# Patient Record
Sex: Male | Born: 2000
Health system: Southern US, Community
[De-identification: ages and names within clinical notes are randomized; demographics above are authoritative.]

---
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
## 2022-03-02 DIAGNOSIS — F4321 Adjustment disorder with depressed mood: Secondary | ICD-10-CM | POA: Diagnosis not present

## 2022-04-04 DIAGNOSIS — F4321 Adjustment disorder with depressed mood: Secondary | ICD-10-CM | POA: Diagnosis not present

## 2022-04-20 IMAGING — CR DG CHEST 2V
2 series · 2 of 2 positions shown · non-contrast
Comparison: None

CLINICAL DATA: Chest pain

EXAM:
CHEST - 2 VIEW

[chest pa]
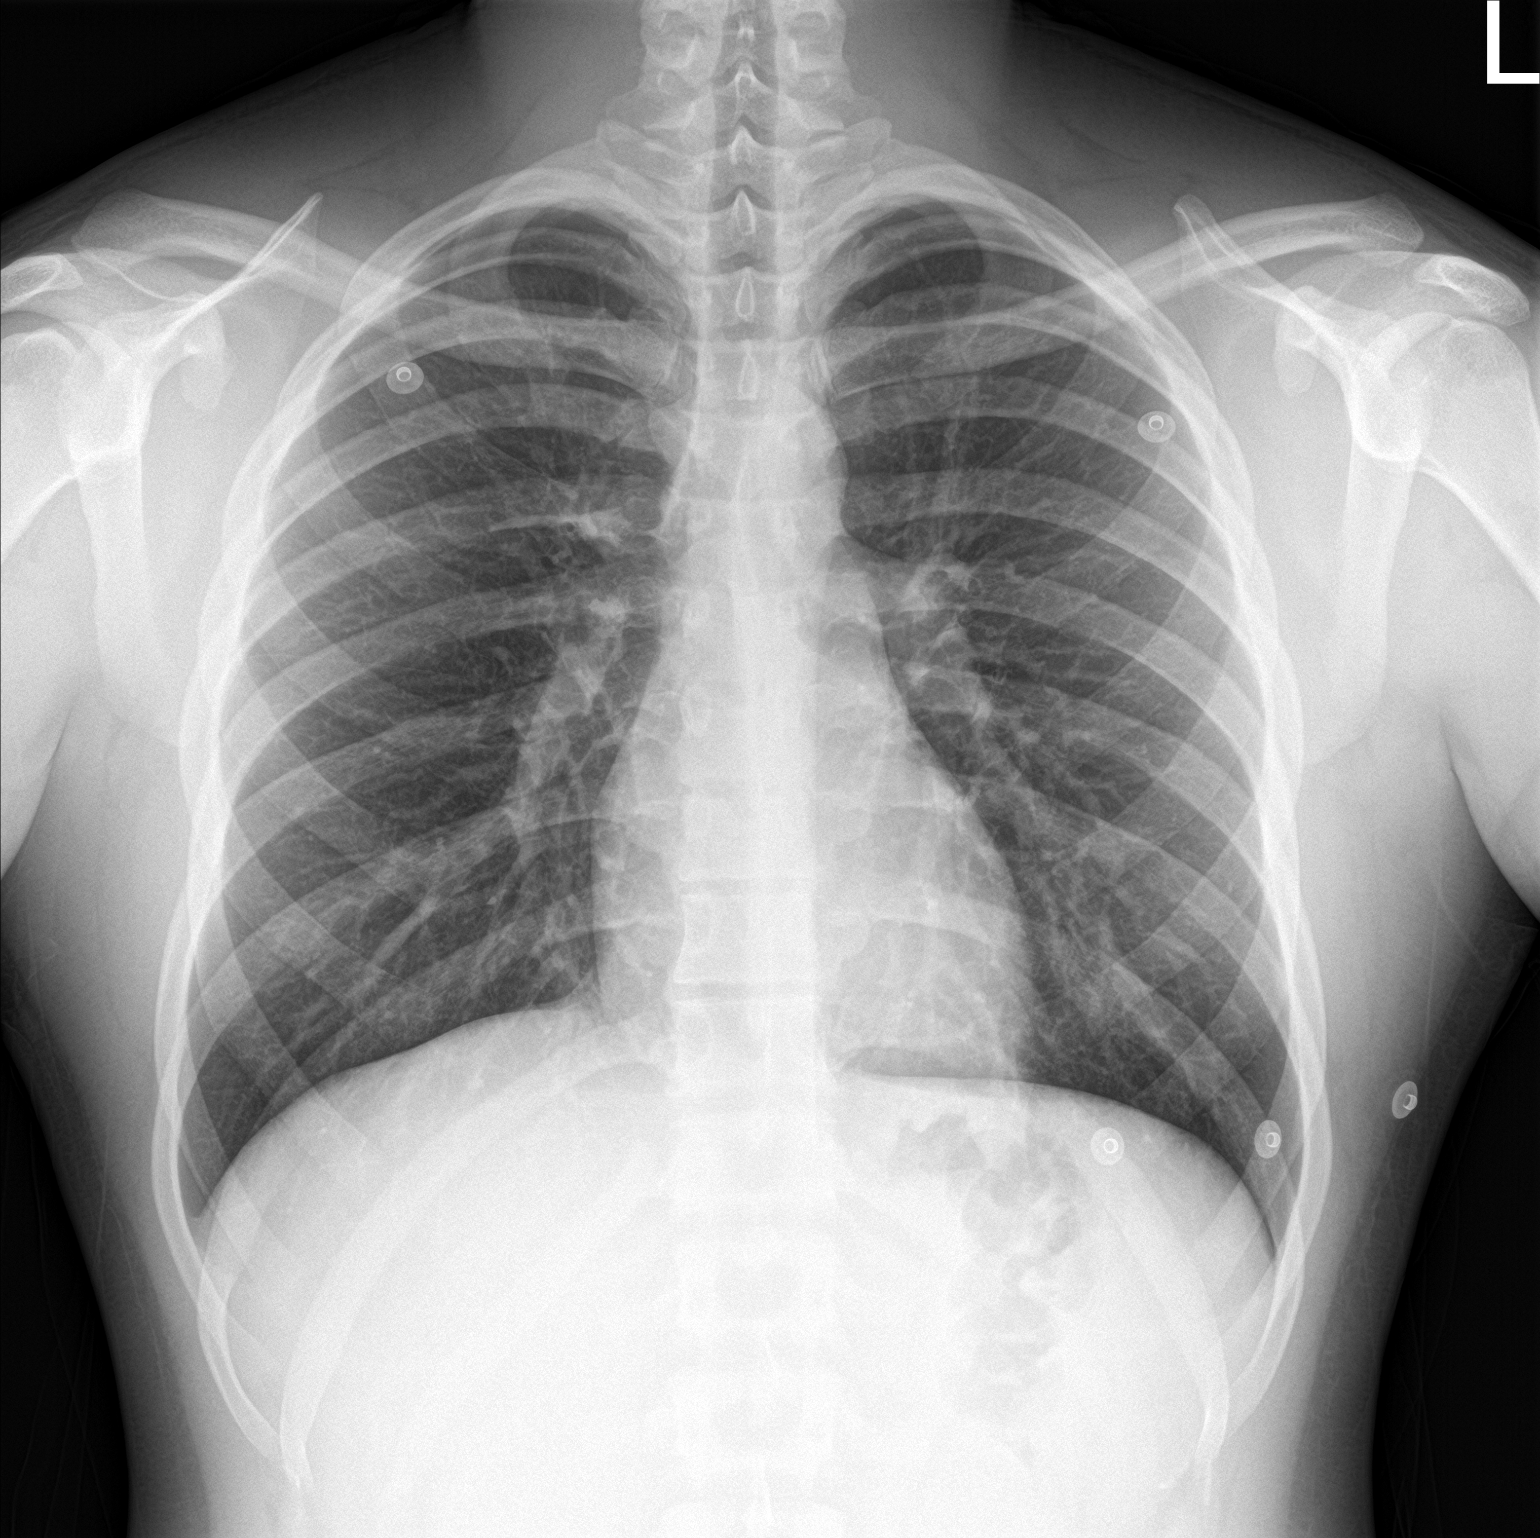

[chest lat]
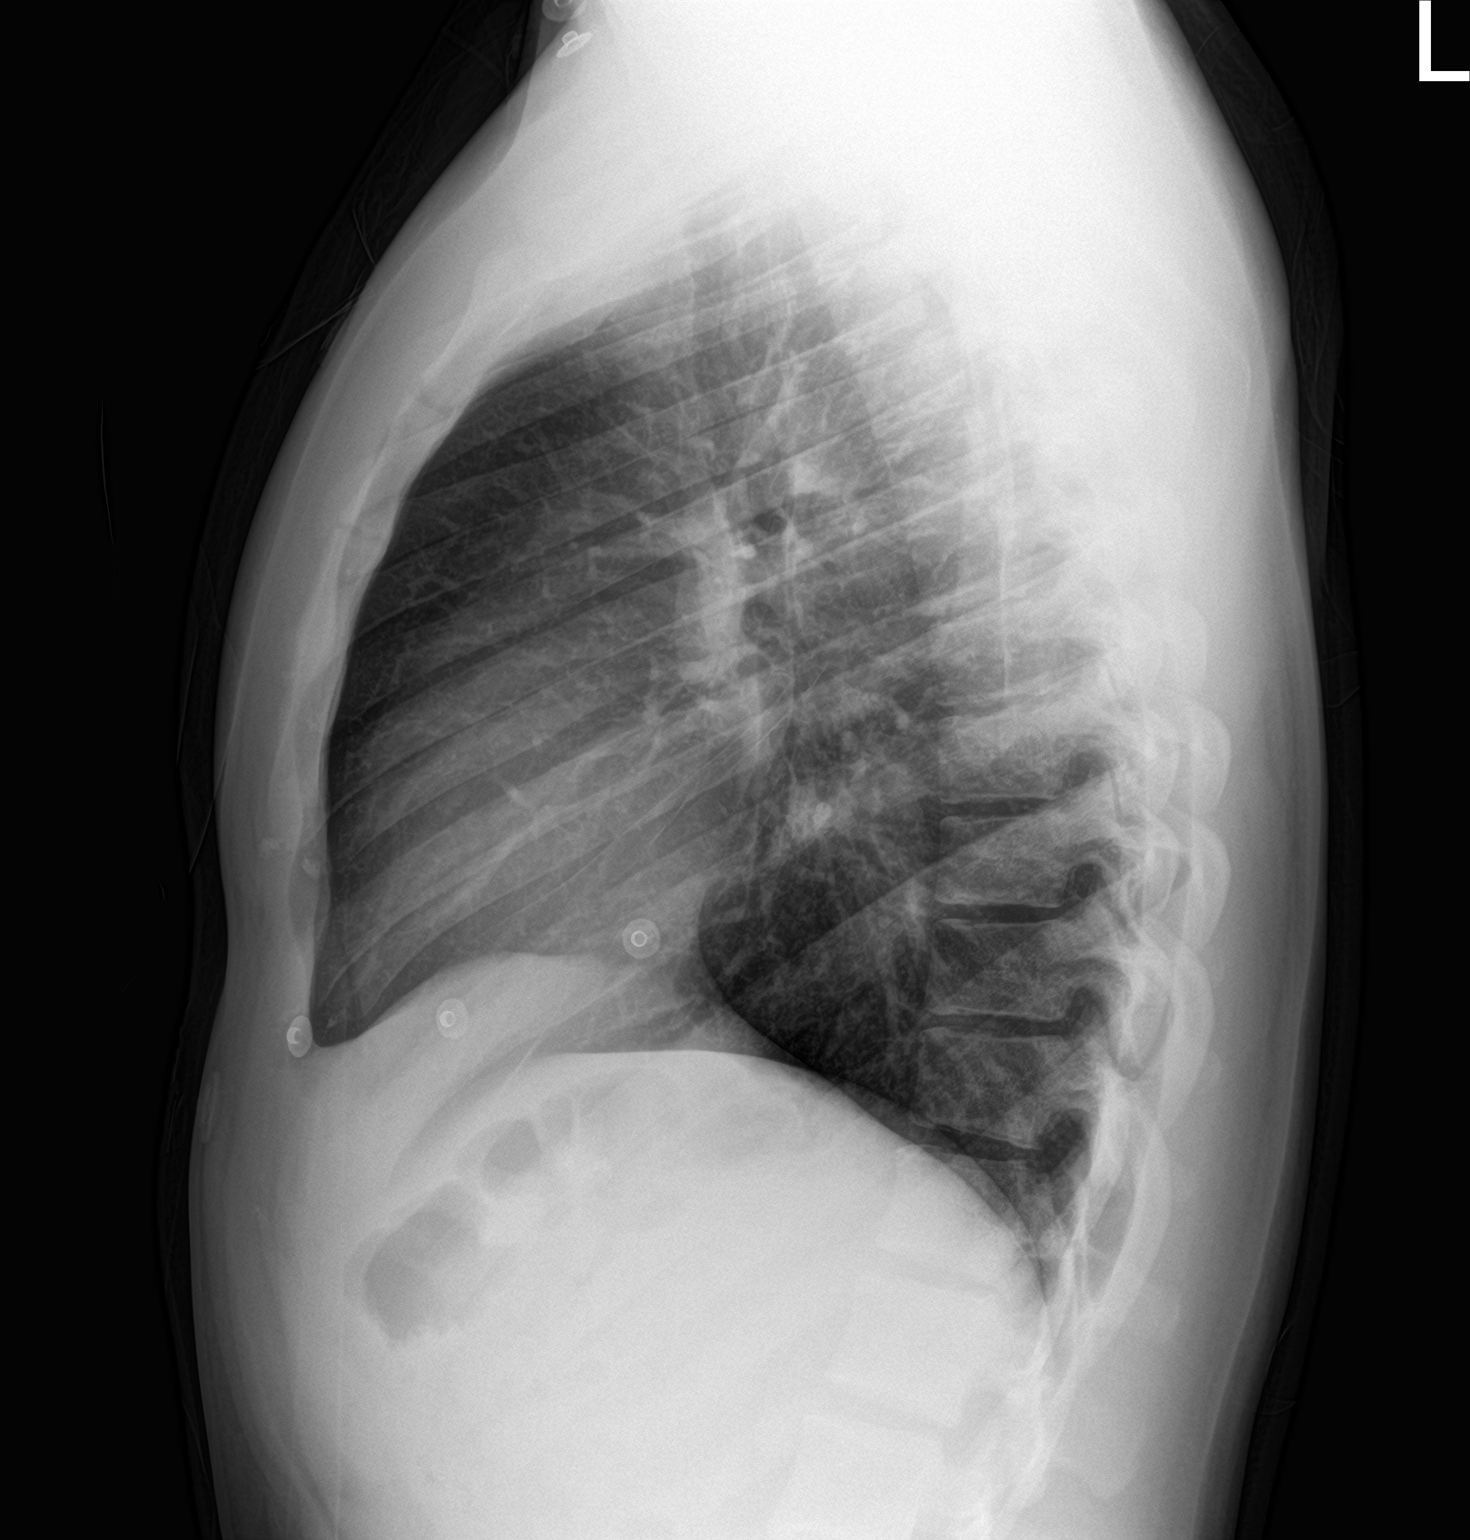

[2 of 2 positions shown; findings below may reference images not displayed]

FINDINGS: The heart size and mediastinal contours are within normal limits.
Both lungs are clear. No pleural effusion or pneumothorax. The
visualized skeletal structures are unremarkable.
IMPRESSION: No active cardiopulmonary disease.

## 2022-05-23 DIAGNOSIS — F4321 Adjustment disorder with depressed mood: Secondary | ICD-10-CM | POA: Diagnosis not present

## 2022-11-02 DIAGNOSIS — F4321 Adjustment disorder with depressed mood: Secondary | ICD-10-CM | POA: Diagnosis not present

## 2023-06-13 DIAGNOSIS — F419 Anxiety disorder, unspecified: Secondary | ICD-10-CM | POA: Diagnosis not present

## 2023-07-03 DIAGNOSIS — F419 Anxiety disorder, unspecified: Secondary | ICD-10-CM | POA: Diagnosis not present

## 2023-07-24 DIAGNOSIS — F419 Anxiety disorder, unspecified: Secondary | ICD-10-CM | POA: Diagnosis not present
